# Patient Record
Sex: Male | Born: 1990 | Race: Black or African American | Hispanic: No | Marital: Single | Smoking: Never smoker
Health system: Southern US, Community
[De-identification: ages and names within clinical notes are randomized; demographics above are authoritative.]

## PROBLEM LIST (undated history)

## (undated) DIAGNOSIS — J45909 Unspecified asthma, uncomplicated: Secondary | ICD-10-CM

## (undated) HISTORY — PX: TOTAL HIP ARTHROPLASTY: SHX124

## (undated) HISTORY — PX: TONSILLECTOMY: SUR1361

## (undated) HISTORY — PX: TESTICLE TORSION REDUCTION: SHX795

---

## 2014-09-14 ENCOUNTER — Emergency Department: Payer: Self-pay | Admitting: Emergency Medicine

## 2014-10-14 ENCOUNTER — Emergency Department: Payer: Self-pay | Admitting: Emergency Medicine

## 2014-10-14 LAB — CBC WITH DIFFERENTIAL/PLATELET
Basophil #: 0 10*3/uL (ref 0.0–0.1)
Basophil %: 0.5 %
Eosinophil #: 0.1 10*3/uL (ref 0.0–0.7)
Eosinophil %: 1.4 %
HCT: 48.6 % (ref 40.0–52.0)
HGB: 15.8 g/dL (ref 13.0–18.0)
LYMPHS ABS: 1.1 10*3/uL (ref 1.0–3.6)
Lymphocyte %: 25.1 %
MCH: 24.7 pg — ABNORMAL LOW (ref 26.0–34.0)
MCHC: 32.4 g/dL (ref 32.0–36.0)
MCV: 76 fL — AB (ref 80–100)
MONOS PCT: 17.1 %
Monocyte #: 0.7 x10 3/mm (ref 0.2–1.0)
NEUTROS PCT: 55.9 %
Neutrophil #: 2.4 10*3/uL (ref 1.4–6.5)
PLATELETS: 216 10*3/uL (ref 150–440)
RBC: 6.37 10*6/uL — AB (ref 4.40–5.90)
RDW: 15 % — ABNORMAL HIGH (ref 11.5–14.5)
WBC: 4.2 10*3/uL (ref 3.8–10.6)

## 2014-10-14 LAB — COMPREHENSIVE METABOLIC PANEL
ALBUMIN: 3.7 g/dL (ref 3.4–5.0)
ALK PHOS: 91 U/L (ref 46–116)
AST: 28 U/L (ref 15–37)
Anion Gap: 7 (ref 7–16)
BUN: 12 mg/dL (ref 7–18)
Bilirubin,Total: 0.4 mg/dL (ref 0.2–1.0)
CALCIUM: 9 mg/dL (ref 8.5–10.1)
Chloride: 104 mmol/L (ref 98–107)
Co2: 27 mmol/L (ref 21–32)
Creatinine: 1.15 mg/dL (ref 0.60–1.30)
EGFR (African American): >60
EGFR (Non-African Amer.): >60
Glucose: 92 mg/dL (ref 65–99)
Osmolality: 275 (ref 275–301)
Potassium: 3.3 mmol/L — ABNORMAL LOW (ref 3.5–5.1)
SGPT (ALT): 49 U/L (ref 14–63)
SODIUM: 138 mmol/L (ref 136–145)
TOTAL PROTEIN: 7.9 g/dL (ref 6.4–8.2)

## 2014-10-14 LAB — URINALYSIS, COMPLETE
Bacteria: NONE SEEN
Bilirubin,UR: NEGATIVE
Glucose,UR: NEGATIVE mg/dL (ref 0–75)
Ketone: NEGATIVE
LEUKOCYTE ESTERASE: NEGATIVE
NITRITE: NEGATIVE
Ph: 5 (ref 4.5–8.0)
Protein: 30
Specific Gravity: 1.032 (ref 1.003–1.030)
WBC UR: 1 /HPF (ref 0–5)

## 2014-10-14 LAB — LIPASE, BLOOD: LIPASE: 139 U/L (ref 73–393)

## 2016-06-12 ENCOUNTER — Emergency Department: Payer: BLUE CROSS/BLUE SHIELD

## 2016-06-12 ENCOUNTER — Emergency Department
Admission: EM | Admit: 2016-06-12 | Discharge: 2016-06-12 | Disposition: A | Discharge status: Home / Self Care | Payer: BLUE CROSS/BLUE SHIELD | Attending: Student in an Organized Health Care Education/Training Program | Admitting: Student in an Organized Health Care Education/Training Program

## 2016-06-12 DIAGNOSIS — S39012A Strain of muscle, fascia and tendon of lower back, initial encounter: Secondary | ICD-10-CM

## 2016-06-12 DIAGNOSIS — Y9389 Activity, other specified: Secondary | ICD-10-CM | POA: Diagnosis not present

## 2016-06-12 DIAGNOSIS — Y999 Unspecified external cause status: Secondary | ICD-10-CM | POA: Diagnosis not present

## 2016-06-12 DIAGNOSIS — Y92410 Unspecified street and highway as the place of occurrence of the external cause: Secondary | ICD-10-CM | POA: Insufficient documentation

## 2016-06-12 DIAGNOSIS — S3992XA Unspecified injury of lower back, initial encounter: Secondary | ICD-10-CM | POA: Diagnosis present

## 2016-06-12 MED ORDER — METHOCARBAMOL 500 MG PO TABS: 500.0000 mg | ORAL_TABLET | Freq: Four times a day (QID) | ORAL | 0 refills | Status: AC

## 2016-06-12 MED ORDER — MELOXICAM 15 MG PO TABS: 15.0000 mg | ORAL_TABLET | Freq: Every day | ORAL | 0 refills | Status: AC
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
## 2017-08-31 ENCOUNTER — Encounter: Payer: Self-pay | Admitting: Emergency Medicine

## 2017-08-31 ENCOUNTER — Emergency Department
Admission: EM | Admit: 2017-08-31 | Discharge: 2017-08-31 | Disposition: A | Discharge status: Home / Self Care | Payer: BLUE CROSS/BLUE SHIELD | Attending: Emergency Medicine | Admitting: Emergency Medicine

## 2017-08-31 DIAGNOSIS — J0100 Acute maxillary sinusitis, unspecified: Secondary | ICD-10-CM | POA: Diagnosis not present

## 2017-08-31 DIAGNOSIS — J45909 Unspecified asthma, uncomplicated: Secondary | ICD-10-CM | POA: Insufficient documentation

## 2017-08-31 DIAGNOSIS — R0981 Nasal congestion: Secondary | ICD-10-CM | POA: Diagnosis present

## 2017-08-31 DIAGNOSIS — Z79899 Other long term (current) drug therapy: Secondary | ICD-10-CM | POA: Insufficient documentation

## 2017-08-31 HISTORY — DX: Unspecified asthma, uncomplicated: J45.909

## 2017-08-31 MED ORDER — NAPROXEN 500 MG PO TABS: 500.0000 mg | ORAL_TABLET | Freq: Two times a day (BID) | ORAL | Status: AC

## 2017-08-31 MED ORDER — ALBUTEROL SULFATE HFA 108 (90 BASE) MCG/ACT IN AERS: 2.0000 | INHALATION_SPRAY | Freq: Four times a day (QID) | RESPIRATORY_TRACT | 2 refills | Status: AC | PRN

## 2017-08-31 MED ORDER — BENZONATATE 100 MG PO CAPS: 200.0000 mg | ORAL_CAPSULE | Freq: Three times a day (TID) | ORAL | 0 refills | Status: AC | PRN

## 2017-08-31 MED ORDER — AMOXICILLIN 500 MG PO CAPS: 500.0000 mg | ORAL_CAPSULE | Freq: Three times a day (TID) | ORAL | 0 refills | Status: AC

## 2017-08-31 MED ORDER — FEXOFENADINE-PSEUDOEPHED ER 60-120 MG PO TB12: 1.0000 | ORAL_TABLET | Freq: Two times a day (BID) | ORAL | 0 refills | Status: AC

## 2017-08-31 NOTE — ED Notes (Signed)
Pt states two days of nasal congestion, scratchy throat and generalized body aches with chills. Pt denies known fever. Pt denies other symptoms. No red swollen tonsils noted on exam. Pt appears in no acute distress. Clear nasal drainage noted.

## 2017-08-31 NOTE — ED Triage Notes (Signed)
Patient with complaint of body aches, chills and congestion times two days. Patient denies fever.

## 2017-08-31 NOTE — ED Provider Notes (Signed)
[facility] Emergency Department Provider Note   ____________________________________________   First MD Initiated Contact with Patient 08/31/17 1954     (approximate)  I have reviewed the triage vital signs and the nursing notes.   HISTORY  Chief Complaint Generalized Body Aches and Nasal Congestion    HPI Collin Cox is a 26 y.o. male patient complaining of body aches, chills, and nasal congestion for 2 days. Patient denies fever. Patient denies nausea, vomiting, diarrhea. Patient also requesting refill of his pro-air for his asthmatic condition.Patient rates his pain as a 6/10. Patient describes pain as "achy". No palliative measures for complaint.   Past Medical History:  Diagnosis Date  . Asthma     There are no active problems to display for this patient.   Past Surgical History:  Procedure Laterality Date  . TOTAL HIP ARTHROPLASTY      Prior to Admission medications   Medication Sig Start Date End Date Taking? Authorizing Provider  albuterol (PROVENTIL HFA;VENTOLIN HFA) 108 (90 Base) MCG/ACT inhaler Inhale 2 puffs into the lungs every 6 (six) hours as needed for wheezing or shortness of breath. 08/31/17   Joni ReiningSmith, Milana Salay K, PA-C  amoxicillin (AMOXIL) 500 MG capsule Take 1 capsule (500 mg total) by mouth 3 (three) times daily. 08/31/17   Joni ReiningSmith, Hasheem Voland K, PA-C  benzonatate (TESSALON PERLES) 100 MG capsule Take 2 capsules (200 mg total) by mouth 3 (three) times daily as needed for cough. 08/31/17 08/31/18  Joni ReiningSmith, Hurbert Duran K, PA-C  fexofenadine-pseudoephedrine (ALLEGRA-D) 60-120 MG 12 hr tablet Take 1 tablet by mouth 2 (two) times daily. 08/31/17   Joni ReiningSmith, Lorie Cleckley K, PA-C  meloxicam (MOBIC) 15 MG tablet Take 1 tablet (15 mg total) by mouth daily. 06/12/16   Cuthriell, Delorise RoyalsJonathan D, PA-C  methocarbamol (ROBAXIN) 500 MG tablet Take 1 tablet (500 mg total) by mouth 4 (four) times daily. 06/12/16   Cuthriell, Delorise RoyalsJonathan D, PA-C  naproxen (NAPROSYN) 500 MG  tablet Take 1 tablet (500 mg total) by mouth 2 (two) times daily with a meal. 08/31/17   Joni ReiningSmith, Jadalee Westcott K, PA-C    Allergies Peanut-containing drug products  No family history on file.  Social History Social History   Tobacco Use  . Smoking status: Never Smoker  . Smokeless tobacco: Current User  Substance Use Topics  . Alcohol use: Yes  . Drug use: No    Review of Systems Constitutional: No fever/chills Eyes: No visual changes. ENT: No sore throat. Nasal congestion and facial pain Cardiovascular: Denies chest pain. Respiratory: Denies shortness of breath. Gastrointestinal: No abdominal pain.  No nausea, no vomiting.  No diarrhea.  No constipation. Genitourinary: Negative for dysuria. Musculoskeletal: Negative for back pain. Skin: Negative for rash. Neurological: Positive for headaches, denies focal weakness or numbness. Allergic/Immunilogical: Peanuts ____________________________________________   PHYSICAL EXAM:  VITAL SIGNS: ED Triage Vitals  Enc Vitals Group     BP 08/31/17 1911 (!) 121/57     Pulse Rate 08/31/17 1911 100     Resp 08/31/17 1911 18     Temp 08/31/17 1911 98.6 F (37 C)     Temp Source 08/31/17 1911 Oral     SpO2 08/31/17 1911 98 %     Weight 08/31/17 1910 225 lb (102.1 kg)     Height 08/31/17 1910 5\' 5"  (1.651 m)     Head Circumference --      Peak Flow --      Pain Score 08/31/17 1909 6     Pain Loc --  Pain Edu? --      Excl. in GC? --    Constitutional: Alert and oriented. Well appearing and in no acute distress. Nose: Bilateral frontal maxillary guarding. Edematous nasal Turbinates with thick greenish rhinorrhea. Mouth/Throat: Mucous membranes are moist.  Oropharynx non-erythematous. Postnasal drainage Neck: No stridor. Hematological/Lymphatic/Immunilogical: No cervical lymphadenopathy. Cardiovascular: Normal rate, regular rhythm. Grossly normal heart sounds.  Good peripheral circulation. Respiratory: Normal respiratory effort.  No  retractions. Lungs CTAB. Neurologic:  Normal speech and language. No gross focal neurologic deficits are appreciated. No gait instability. Skin:  Skin is warm, dry and intact. No rash noted. Psychiatric: Mood and affect are normal. Speech and behavior are normal.  ____________________________________________   LABS (all labs ordered are listed, but only abnormal results are displayed)  Labs Reviewed - No data to display ____________________________________________  EKG   ____________________________________________  RADIOLOGY  No results found.  ____________________________________________   PROCEDURES  Procedure(s) performed: None  Procedures  Critical Care performed: No  ____________________________________________   INITIAL IMPRESSION / ASSESSMENT AND PLAN / ED COURSE  As part of my medical decision making, I reviewed the following data within the electronic MEDICAL RECORD NUMBER    Sinusitis. Patient given discharge Instructions. Patient advised take medication as directed. Patient advised [facility] with [facility] condition persists.      ____________________________________________   FINAL CLINICAL IMPRESSION(S) / ED DIAGNOSES  Final diagnoses:  Subacute maxillary sinusitis     ED Discharge Orders        Ordered    amoxicillin (AMOXIL) 500 MG capsule  3 times daily     08/31/17 1959    fexofenadine-pseudoephedrine (ALLEGRA-D) 60-120 MG 12 hr tablet  2 times daily     08/31/17 1959    benzonatate (TESSALON PERLES) 100 MG capsule  3 times daily PRN     08/31/17 1959    naproxen (NAPROSYN) 500 MG tablet  2 times daily with meals     08/31/17 1959    albuterol (PROVENTIL HFA;VENTOLIN HFA) 108 (90 Base) MCG/ACT inhaler  Every 6 hours PRN     08/31/17 2001       Note:  This document was prepared using Dragon voice recognition software and may include unintentional dictation errors.    Joni ReiningSmith, Brady Schiller K, PA-C 08/31/17 2006      Arnaldo NatalMalinda, Paul F, MD 08/31/17 2008

## 2017-10-19 ENCOUNTER — Emergency Department
Admission: EM | Admit: 2017-10-19 | Discharge: 2017-10-19 | Disposition: A | Discharge status: Home / Self Care | Payer: BLUE CROSS/BLUE SHIELD | Attending: Emergency Medicine | Admitting: Emergency Medicine

## 2017-10-19 ENCOUNTER — Encounter: Payer: Self-pay | Admitting: Emergency Medicine

## 2017-10-19 DIAGNOSIS — R1084 Generalized abdominal pain: Secondary | ICD-10-CM | POA: Diagnosis present

## 2017-10-19 DIAGNOSIS — Z79899 Other long term (current) drug therapy: Secondary | ICD-10-CM | POA: Insufficient documentation

## 2017-10-19 DIAGNOSIS — R109 Unspecified abdominal pain: Secondary | ICD-10-CM

## 2017-10-19 DIAGNOSIS — J45909 Unspecified asthma, uncomplicated: Secondary | ICD-10-CM | POA: Diagnosis not present

## 2017-10-19 LAB — CBC
HCT: 47.9 % (ref 40.0–52.0)
Hemoglobin: 15.7 g/dL (ref 13.0–18.0)
MCH: 24.4 pg — AB (ref 26.0–34.0)
MCHC: 32.7 g/dL (ref 32.0–36.0)
MCV: 74.6 fL — AB (ref 80.0–100.0)
PLATELETS: 273 10*3/uL (ref 150–440)
RBC: 6.42 MIL/uL — ABNORMAL HIGH (ref 4.40–5.90)
RDW: 15.2 % — AB (ref 11.5–14.5)
WBC: 9.6 10*3/uL (ref 3.8–10.6)

## 2017-10-19 LAB — URINALYSIS, COMPLETE (UACMP) WITH MICROSCOPIC
Bacteria, UA: NONE SEEN
Bilirubin Urine: NEGATIVE
Glucose, UA: NEGATIVE mg/dL
Ketones, ur: NEGATIVE mg/dL
Leukocytes, UA: NEGATIVE
Nitrite: NEGATIVE
PH: 5 (ref 5.0–8.0)
Protein, ur: NEGATIVE mg/dL
SPECIFIC GRAVITY, URINE: 1.026 (ref 1.005–1.030)

## 2017-10-19 LAB — COMPREHENSIVE METABOLIC PANEL
ALK PHOS: 108 U/L (ref 38–126)
ALT: 42 U/L (ref 17–63)
AST: 36 U/L (ref 15–41)
Albumin: 4.3 g/dL (ref 3.5–5.0)
Anion gap: 9 (ref 5–15)
BUN: 14 mg/dL (ref 6–20)
CHLORIDE: 103 mmol/L (ref 101–111)
CO2: 26 mmol/L (ref 22–32)
CREATININE: 0.98 mg/dL (ref 0.61–1.24)
Calcium: 9 mg/dL (ref 8.9–10.3)
GFR calc Af Amer: >60 mL/min (ref >60)
Glucose, Bld: 99 mg/dL (ref 65–99)
Potassium: 3.6 mmol/L (ref 3.5–5.1)
SODIUM: 138 mmol/L (ref 135–145)
Total Bilirubin: 0.9 mg/dL (ref 0.3–1.2)
Total Protein: 8 g/dL (ref 6.5–8.1)

## 2017-10-19 LAB — LIPASE, BLOOD: Lipase: 45 U/L (ref 11–51)

## 2017-10-19 NOTE — ED Triage Notes (Signed)
Patient with complaint of intermittent lower abdominal pain with nausea that started today. Denies urinary symptoms.

## 2017-10-19 NOTE — ED Notes (Addendum)
Pt states lower belly pain that began last week with N&V&D. Pt was told he had a "stomach bug" states he got over that and felt better but symptoms began again this AM. Denies urinary symptoms. Alert, oriented, no distress noted.

## 2017-10-19 NOTE — ED Provider Notes (Signed)
[facility] Emergency Department Provider Note  ____________________________________________   I have reviewed the triage vital signs and the nursing notes. Where available I have reviewed prior notes and, if possible and indicated, outside hospital notes.    HISTORY  Chief Complaint Abdominal Pain    HPI Collin Cox is a 27 y.o. male who presents today complaining of a crampy discomfort in his abdomen which is been there off and on a little bit today.  He is not having it now.  He has no pain of any variety at this time.  He denies any nausea vomiting or diarrhea.  He actually has had no other symptoms he did have a abdominal viral pathology believes 1 week ago with nausea vomiting diarrhea but that is gone.  He is worried it might be coming back as he had a cramp today.  He states he was in the lower abdomen region, but he cannot be specific about where it hurt because it is gone and only her for a brief period of time.  Crampy discomfort, no radiation, happened at rest, no other associated symptoms no prior treatment.  Severity was mild.  And it is completely gone     Past Medical History:  Diagnosis Date  . Asthma     There are no active problems to display for this patient.   Past Surgical History:  Procedure Laterality Date  . TESTICLE TORSION REDUCTION    . TONSILLECTOMY    . TOTAL HIP ARTHROPLASTY      Prior to Admission medications   Medication Sig Start Date End Date Taking? Authorizing Provider  albuterol (PROVENTIL HFA;VENTOLIN HFA) 108 (90 Base) MCG/ACT inhaler Inhale 2 puffs into the lungs every 6 (six) hours as needed for wheezing or shortness of breath. 08/31/17   Joni Reining, PA-C  amoxicillin (AMOXIL) 500 MG capsule Take 1 capsule (500 mg total) by mouth 3 (three) times daily. 08/31/17   Joni Reining, PA-C  benzonatate (TESSALON PERLES) 100 MG capsule Take 2 capsules (200 mg total) by mouth 3 (three) times daily as needed for  cough. 08/31/17 08/31/18  Joni Reining, PA-C  fexofenadine-pseudoephedrine (ALLEGRA-D) 60-120 MG 12 hr tablet Take 1 tablet by mouth 2 (two) times daily. 08/31/17   Joni Reining, PA-C  meloxicam (MOBIC) 15 MG tablet Take 1 tablet (15 mg total) by mouth daily. 06/12/16   Cuthriell, Delorise Royals, PA-C  methocarbamol (ROBAXIN) 500 MG tablet Take 1 tablet (500 mg total) by mouth 4 (four) times daily. 06/12/16   Cuthriell, Delorise Royals, PA-C  naproxen (NAPROSYN) 500 MG tablet Take 1 tablet (500 mg total) by mouth 2 (two) times daily with a meal. 08/31/17   Joni Reining, PA-C    Allergies Peanut-containing drug products  No family history on file.  Social History Social History   Tobacco Use  . Smoking status: Never Smoker  . Smokeless tobacco: Current User  Substance Use Topics  . Alcohol use: Yes    Comment: occ  . Drug use: No    Review of Systems Constitutional: No fever/chills Eyes: No visual changes. ENT: No sore throat. No stiff neck no neck pain Cardiovascular: Denies chest pain. Respiratory: Denies shortness of breath. Gastrointestinal:   no vomiting.  No diarrhea.  No constipation. Genitourinary: Negative for dysuria. Musculoskeletal: Negative lower extremity swelling Skin: Negative for rash. Neurological: Negative for severe headaches, focal weakness or numbness.   ____________________________________________   PHYSICAL EXAM:  VITAL SIGNS: ED Triage Vitals [10/19/17  2035]  Enc Vitals Group     BP (!) 122/26     Pulse Rate 77     Resp 18     Temp 98.4 F (36.9 C)     Temp Source Oral     SpO2 98 %     Weight 215 lb (97.5 kg)     Height 5\' 5"  (1.651 m)     Head Circumference      Peak Flow      Pain Score 8     Pain Loc      Pain Edu?      Excl. in GC?     Constitutional: Alert and oriented. Well appearing and in no acute distress. Eyes: Conjunctivae are normal Head: Atraumatic HEENT: No congestion/rhinnorhea. Mucous membranes are moist.   Oropharynx non-erythematous Neck:   Nontender with no meningismus, no masses, no stridor Cardiovascular: Normal rate, regular rhythm. Grossly normal heart sounds.  Good peripheral circulation. Respiratory: Normal respiratory effort.  No retractions. Lungs CTAB. Abdominal: Soft and nontender. No distention. No guarding no rebound Back:  There is no focal tenderness or step off.  there is no midline tenderness there are no lesions noted. there is no CVA tenderness GU: Normal external male genitalia no evidence of torsion or lesions or mass Musculoskeletal: No lower extremity tenderness, no upper extremity tenderness. No joint effusions, no DVT signs strong distal pulses no edema Neurologic:  Normal speech and language. No gross focal neurologic deficits are appreciated.  Skin:  Skin is warm, dry and intact. No rash noted. Psychiatric: Mood and affect are normal. Speech and behavior are normal.  ____________________________________________   LABS (all labs ordered are listed, but only abnormal results are displayed)  Labs Reviewed  CBC - Abnormal; Notable for the following components:      Result Value   RBC 6.42 (*)    MCV 74.6 (*)    MCH 24.4 (*)    RDW 15.2 (*)    All other components within normal limits  URINALYSIS, COMPLETE (UACMP) WITH MICROSCOPIC - Abnormal; Notable for the following components:   Color, Urine YELLOW (*)    APPearance CLEAR (*)    Hgb urine dipstick SMALL (*)    Squamous Epithelial / LPF 0-5 (*)    All other components within normal limits  COMPREHENSIVE METABOLIC PANEL  LIPASE, BLOOD    Pertinent labs  results that were available during my care of the patient were reviewed by me and considered in my medical decision making (see chart for details). ____________________________________________  EKG  I personally interpreted any EKGs ordered by me or triage  ____________________________________________  RADIOLOGY  Pertinent labs & imaging results that  were available during my care of the patient were reviewed by me and considered in my medical decision making (see chart for details). If possible, patient and/or family made aware of any abnormal findings.  No results found. ____________________________________________    PROCEDURES  Procedure(s) performed: None  Procedures  Critical Care performed: None  ____________________________________________   INITIAL IMPRESSION / ASSESSMENT AND PLAN / ED COURSE  Pertinent labs & imaging results that were available during my care of the patient were reviewed by me and considered in my medical decision making (see chart for details).  Patient had a brief period of abdominal cramping which is completely gone and unreproducible by me, blood work is reassuring, no evidence considering the patient's symptoms, medical history, and physical examination today, I have low suspicion for cholecystitis or biliary pathology, pancreatitis,  perforation or bowel obstruction, hernia, intra-abdominal abscess, AAA or dissection, volvulus or intussusception, mesenteric ischemia, ischemic gut, pyelonephritis or appendicitis.   ____________________________________________   FINAL CLINICAL IMPRESSION(S) / ED DIAGNOSES  Final diagnoses:  None      This chart was dictated using voice recognition software.  Despite best efforts to proofread,  errors can occur which can change meaning.      Jeanmarie PlantMcShane, Kyheem Bathgate A, MD 10/19/17 2213

## 2017-10-19 NOTE — ED Notes (Signed)
Pt given gingerale and graham crackers for PO challenge  

## 2018-04-01 ENCOUNTER — Other Ambulatory Visit: Payer: Self-pay

## 2018-04-01 ENCOUNTER — Emergency Department
Admission: EM | Admit: 2018-04-01 | Discharge: 2018-04-01 | Disposition: A | Discharge status: Home / Self Care | Payer: BLUE CROSS/BLUE SHIELD | Attending: Emergency Medicine | Admitting: Emergency Medicine

## 2018-04-01 ENCOUNTER — Encounter: Payer: Self-pay | Admitting: *Deleted

## 2018-04-01 DIAGNOSIS — R51 Headache: Secondary | ICD-10-CM | POA: Diagnosis not present

## 2018-04-01 DIAGNOSIS — R42 Dizziness and giddiness: Secondary | ICD-10-CM | POA: Diagnosis present

## 2018-04-01 DIAGNOSIS — Z79899 Other long term (current) drug therapy: Secondary | ICD-10-CM | POA: Insufficient documentation

## 2018-04-01 DIAGNOSIS — E860 Dehydration: Secondary | ICD-10-CM | POA: Diagnosis not present

## 2018-04-01 DIAGNOSIS — F17290 Nicotine dependence, other tobacco product, uncomplicated: Secondary | ICD-10-CM | POA: Insufficient documentation

## 2018-04-01 DIAGNOSIS — J45909 Unspecified asthma, uncomplicated: Secondary | ICD-10-CM | POA: Insufficient documentation

## 2018-04-01 DIAGNOSIS — R110 Nausea: Secondary | ICD-10-CM | POA: Insufficient documentation

## 2018-04-01 DIAGNOSIS — Z96649 Presence of unspecified artificial hip joint: Secondary | ICD-10-CM | POA: Insufficient documentation

## 2018-04-01 LAB — URINALYSIS, COMPLETE (UACMP) WITH MICROSCOPIC
BACTERIA UA: NONE SEEN
BILIRUBIN URINE: NEGATIVE
GLUCOSE, UA: NEGATIVE mg/dL
Ketones, ur: NEGATIVE mg/dL
LEUKOCYTES UA: NEGATIVE
NITRITE: NEGATIVE
PH: 5 (ref 5.0–8.0)
Protein, ur: NEGATIVE mg/dL
SPECIFIC GRAVITY, URINE: 1.019 (ref 1.005–1.030)

## 2018-04-01 LAB — CBC
HCT: 43.8 % (ref 40.0–52.0)
Hemoglobin: 14.8 g/dL (ref 13.0–18.0)
MCH: 25.6 pg — ABNORMAL LOW (ref 26.0–34.0)
MCHC: 33.8 g/dL (ref 32.0–36.0)
MCV: 75.5 fL — ABNORMAL LOW (ref 80.0–100.0)
Platelets: 185 10*3/uL (ref 150–440)
RBC: 5.8 MIL/uL (ref 4.40–5.90)
RDW: 15.5 % — AB (ref 11.5–14.5)
WBC: 4.2 10*3/uL (ref 3.8–10.6)

## 2018-04-01 LAB — BASIC METABOLIC PANEL
ANION GAP: 8 (ref 5–15)
BUN: 11 mg/dL (ref 6–20)
CALCIUM: 9 mg/dL (ref 8.9–10.3)
CO2: 26 mmol/L (ref 22–32)
Chloride: 107 mmol/L (ref 98–111)
Creatinine, Ser: 1.11 mg/dL (ref 0.61–1.24)
GFR calc Af Amer: >60 mL/min (ref >60)
Glucose, Bld: 121 mg/dL — ABNORMAL HIGH (ref 70–99)
Potassium: 3.5 mmol/L (ref 3.5–5.1)
SODIUM: 141 mmol/L (ref 135–145)

## 2018-04-01 MED ORDER — SODIUM CHLORIDE 0.9 % IV BOLUS
1000.0000 mL | Freq: Once | INTRAVENOUS | Status: AC
  Administered 2018-04-01: 1000 mL via INTRAVENOUS

## 2018-04-01 NOTE — ED Notes (Signed)
ED Provider at bedside. 

## 2018-04-01 NOTE — ED Notes (Signed)
Report off to vanessa rn 

## 2018-04-01 NOTE — ED Triage Notes (Signed)
Pt to Ed reporting two days of dizziness, headache, cold chills and nausea that has not improved. Pt has been drinking water and denies vomiting or diarrhea. Pt reports having felt warm yesterday but did not check a temp. No abd pain reported. No hx of vertigo.

## 2018-04-01 NOTE — ED Provider Notes (Signed)
[facility] Emergency Department Provider Note   ____________________________________________   I have reviewed the triage vital signs and the nursing notes.   HISTORY  Chief Complaint Dizziness and Nausea   History limited by: Not Limited   HPI Collin Cox is a 27 y.o. male who presents to the emergency department today because of concern for dizziness and nausea.  He states that he started feeling bad yesterday.  He appreciated when he woke up.  He noticed that he started feeling dizzy especially when he was sitting down.  He also felt nauseous although had no vomiting.  He has had some associated headache.  Denies any fevers.  Per medical record review patient has a history of asthma  Past Medical History:  Diagnosis Date  . Asthma     There are no active problems to display for this patient.   Past Surgical History:  Procedure Laterality Date  . TESTICLE TORSION REDUCTION    . TONSILLECTOMY    . TOTAL HIP ARTHROPLASTY      Prior to Admission medications   Medication Sig Start Date End Date Taking? Authorizing Provider  albuterol (PROVENTIL HFA;VENTOLIN HFA) 108 (90 Base) MCG/ACT inhaler Inhale 2 puffs into the lungs every 6 (six) hours as needed for wheezing or shortness of breath. 08/31/17   Joni ReiningSmith, Ronald K, PA-C  amoxicillin (AMOXIL) 500 MG capsule Take 1 capsule (500 mg total) by mouth 3 (three) times daily. 08/31/17   Joni ReiningSmith, Ronald K, PA-C  benzonatate (TESSALON PERLES) 100 MG capsule Take 2 capsules (200 mg total) by mouth 3 (three) times daily as needed for cough. 08/31/17 08/31/18  Joni ReiningSmith, Ronald K, PA-C  fexofenadine-pseudoephedrine (ALLEGRA-D) 60-120 MG 12 hr tablet Take 1 tablet by mouth 2 (two) times daily. 08/31/17   Joni ReiningSmith, Ronald K, PA-C  meloxicam (MOBIC) 15 MG tablet Take 1 tablet (15 mg total) by mouth daily. 06/12/16   Cuthriell, Delorise RoyalsJonathan D, PA-C  methocarbamol (ROBAXIN) 500 MG tablet Take 1 tablet (500 mg total) by mouth 4  (four) times daily. 06/12/16   Cuthriell, Delorise RoyalsJonathan D, PA-C  naproxen (NAPROSYN) 500 MG tablet Take 1 tablet (500 mg total) by mouth 2 (two) times daily with a meal. 08/31/17   Joni ReiningSmith, Ronald K, PA-C    Allergies Peanut-containing drug products  History reviewed. No pertinent family history.  Social History Social History   Tobacco Use  . Smoking status: Never Smoker  . Smokeless tobacco: Current User  Substance Use Topics  . Alcohol use: Yes    Comment: occ  . Drug use: No    Review of Systems Constitutional: No fever/chills Eyes: No visual changes. ENT: No sore throat. Cardiovascular: Denies chest pain. Respiratory: Denies shortness of breath. Gastrointestinal: Positive for nausea.  Genitourinary: Negative for dysuria. Musculoskeletal: Negative for back pain. Skin: Negative for rash. Neurological: Positive for headache and dizziness.  ____________________________________________   PHYSICAL EXAM:  VITAL SIGNS: ED Triage Vitals [04/01/18 1651]  Enc Vitals Group     BP      Pulse Rate (!) 56     Resp 16     Temp 98.7 F (37.1 C)     Temp Source Oral     SpO2 95 %     Weight 194 lb (88 kg)     Height 5\' 5"  (1.651 m)     Head Circumference      Peak Flow      Pain Score 5   Constitutional: Alert and oriented.  Eyes: Conjunctivae are normal.  ENT      Head: Normocephalic and atraumatic.      Nose: No congestion/rhinnorhea.      Mouth/Throat: Mucous membranes are moist.      Neck: No stridor. Hematological/Lymphatic/Immunilogical: No cervical lymphadenopathy. Cardiovascular: Normal rate, regular rhythm.  No murmurs, rubs, or gallops.  Respiratory: Normal respiratory effort without tachypnea nor retractions. Breath sounds are clear and equal bilaterally. No wheezes/rales/rhonchi. Gastrointestinal: Soft and non tender. No rebound. No guarding.  Genitourinary: Deferred Musculoskeletal: Normal range of motion in all extremities. No lower extremity  edema. Neurologic:  Normal speech and language. No gross focal neurologic deficits are appreciated.  Skin:  Skin is warm, dry and intact. No rash noted. Psychiatric: Mood and affect are normal. Speech and behavior are normal. Patient exhibits appropriate insight and judgment.  ____________________________________________    LABS (pertinent positives/negatives)  BMP na 141, k 3.5, glu 121, cr 1.11 CBC wbc 4.2, hgb 14.8, plt 185 UA hazy, 6-10 wbc, bacteria none seen  ____________________________________________   EKG  I, Phineas Semen, attending physician, personally viewed and interpreted this EKG  EKG Time: 1655 Rate: 79 Rhythm: normal sinus rhythm Axis: normal Intervals: qtc 435 QRS: narrow ST changes: no st elevation Impression: normal ekg   ____________________________________________    RADIOLOGY  None  ____________________________________________   PROCEDURES  Procedures  ____________________________________________   INITIAL IMPRESSION / ASSESSMENT AND PLAN / ED COURSE  Pertinent labs & imaging results that were available during my care of the patient were reviewed by me and considered in my medical decision making (see chart for details).   Patient presented with symptoms concerning for possible heat exhaustion and dehydration.  Was given IV fluids and stated that he felt better after fluids.  He was able to get up and walk around.  Discussed importance of hydration with the patient.   ____________________________________________   FINAL CLINICAL IMPRESSION(S) / ED DIAGNOSES  Final diagnoses:  Dehydration     Note: This dictation was prepared with Dragon dictation. Any transcriptional errors that result from this process are unintentional     Phineas Semen, MD 04/01/18 1942

## 2018-04-01 NOTE — Discharge Instructions (Addendum)
Please seek medical attention for any high fevers, chest pain, shortness of breath, change in behavior, persistent vomiting, bloody stool or any other new or concerning symptoms.  

## 2018-04-01 NOTE — ED Notes (Signed)
Pt reports dizziness and a headache.  Sx for 3 days.  Sx worse today.  No n/v.  Pt took asa today for the h/a.  No relief with otc meds.  Pt alert  Speech clear.

## 2018-08-11 ENCOUNTER — Emergency Department
Admission: EM | Admit: 2018-08-11 | Discharge: 2018-08-11 | Disposition: A | Discharge status: Home / Self Care | Payer: BLUE CROSS/BLUE SHIELD | Attending: Emergency Medicine | Admitting: Emergency Medicine

## 2018-08-11 ENCOUNTER — Other Ambulatory Visit: Payer: Self-pay

## 2018-08-11 DIAGNOSIS — F17228 Nicotine dependence, chewing tobacco, with other nicotine-induced disorders: Secondary | ICD-10-CM | POA: Diagnosis not present

## 2018-08-11 DIAGNOSIS — J45909 Unspecified asthma, uncomplicated: Secondary | ICD-10-CM | POA: Diagnosis not present

## 2018-08-11 DIAGNOSIS — Z96649 Presence of unspecified artificial hip joint: Secondary | ICD-10-CM | POA: Insufficient documentation

## 2018-08-11 DIAGNOSIS — J020 Streptococcal pharyngitis: Secondary | ICD-10-CM | POA: Diagnosis not present

## 2018-08-11 DIAGNOSIS — Z91010 Allergy to peanuts: Secondary | ICD-10-CM | POA: Insufficient documentation

## 2018-08-11 DIAGNOSIS — R070 Pain in throat: Secondary | ICD-10-CM | POA: Diagnosis present

## 2018-08-11 LAB — GROUP A STREP BY PCR: GROUP A STREP BY PCR: DETECTED — AB

## 2018-08-11 MED ORDER — PENICILLIN G BENZATHINE 1200000 UNIT/2ML IM SUSP
1.2000 10*6.[IU] | Freq: Once | INTRAMUSCULAR | Status: AC
  Administered 2018-08-11: 1.2 10*6.[IU] via INTRAMUSCULAR
  Filled 2018-08-11: qty 2

## 2018-08-11 NOTE — ED Provider Notes (Signed)
[facility] Emergency Department Provider Note ____________________________________________  Time seen: 1958  I have reviewed the triage vital signs and the nursing notes.  HISTORY  Chief Complaint  Sore Throat  HPI Collin Cox is a 27 y.o. male who presents himself to the ED for evaluation of sudden onset of sore throat and subjective fevers.  Patient describes his wife was recently diagnosed with strep pharyngitis.  Past Medical History:  Diagnosis Date  . Asthma     There are no active problems to display for this patient.   Past Surgical History:  Procedure Laterality Date  . TESTICLE TORSION REDUCTION    . TONSILLECTOMY    . TOTAL HIP ARTHROPLASTY      Prior to Admission medications   Medication Sig Start Date End Date Taking? Authorizing Provider  albuterol (PROVENTIL HFA;VENTOLIN HFA) 108 (90 Base) MCG/ACT inhaler Inhale 2 puffs into the lungs every 6 (six) hours as needed for wheezing or shortness of breath. 08/31/17   Collin Cox, Collin K, PA-C  amoxicillin (AMOXIL) 500 MG capsule Take 1 capsule (500 mg total) by mouth 3 (three) times daily. 08/31/17   Collin Cox, Collin K, PA-C  benzonatate (TESSALON PERLES) 100 MG capsule Take 2 capsules (200 mg total) by mouth 3 (three) times daily as needed for cough. 08/31/17 08/31/18  Collin Cox, Collin K, PA-C  fexofenadine-pseudoephedrine (ALLEGRA-Cox) 60-120 MG 12 hr tablet Take 1 tablet by mouth 2 (two) times daily. 08/31/17   Collin Cox, Collin K, PA-C  meloxicam (MOBIC) 15 MG tablet Take 1 tablet (15 mg total) by mouth daily. 06/12/16   Collin Cox D, PA-C  methocarbamol (ROBAXIN) 500 MG tablet Take 1 tablet (500 mg total) by mouth 4 (four) times daily. 06/12/16   Collin Cox D, PA-C  naproxen (NAPROSYN) 500 MG tablet Take 1 tablet (500 mg total) by mouth 2 (two) times daily with a meal. 08/31/17   Collin Cox, Collin K, PA-C    Allergies Peanut-containing drug products  No family history on file.  Social  History Social History   Tobacco Use  . Smoking status: Never Smoker  . Smokeless tobacco: Current User  Substance Use Topics  . Alcohol use: Yes    Comment: occ  . Drug use: No    Review of Systems  Constitutional: Positive for fever. Eyes: Negative for visual changes. ENT: Active for sore throat. Cardiovascular: Negative for chest pain. Respiratory: Negative for shortness of breath. Gastrointestinal: Negative for abdominal pain, vomiting and diarrhea. Genitourinary: Negative for dysuria. Musculoskeletal: Negative for back pain. Skin: Negative for rash. Neurological: Negative for headaches, focal weakness or numbness. ____________________________________________  PHYSICAL EXAM:  VITAL SIGNS: ED Triage Vitals  Enc Vitals Group     BP 08/11/18 1934 (!) 132/92     Pulse Rate 08/11/18 1934 (!) 101     Resp 08/11/18 1934 16     Temp 08/11/18 1934 99.7 F (37.6 C)     Temp Source 08/11/18 1934 Oral     SpO2 08/11/18 1934 97 %     Weight 08/11/18 1934 220 lb (99.8 kg)     Height 08/11/18 1934 5\' 9"  (1.753 m)     Head Circumference --      Peak Flow --      Pain Score 08/11/18 1936 5     Pain Loc --      Pain Edu? --      Excl. in [place]? --     Constitutional: Alert and oriented. Well appearing and in no distress. Head:  Normocephalic and atraumatic. Eyes: Conjunctivae are normal. PERRL. Normal extraocular movements Mouth/Throat: Mucous membranes are moist.  Uvula is midline and tonsils are erythematous, and edematous. Neck: Supple. No thyromegaly. Hematological/Lymphatic/Immunological: No cervical lymphadenopathy. Cardiovascular: Normal rate, regular rhythm. Normal distal pulses. Respiratory: Normal respiratory effort. No wheezes/rales/rhonchi. ____________________________________________   LABS (pertinent positives/negatives) Labs Reviewed  GROUP A STREP BY PCR - Abnormal; Notable for the following components:      Result Value   Group A Strep by PCR DETECTED (*)     All other components within normal limits  ____________________________________________  PROCEDURES  Procedures Penicillin G 1.2 million units IM ____________________________________________  INITIAL IMPRESSION / ASSESSMENT AND PLAN / ED COURSE  Patient with ED evaluation of sore throat. His strep PCR is positive. He is treated with a single dose of penicillin G in the ED. A work note is provided for 2 days.  ____________________________________________  FINAL CLINICAL IMPRESSION(S) / ED DIAGNOSES  Final diagnoses:  Strep pharyngitis      Collin Cox, Collin Ivory, PA-C 08/11/18 2148    Collin Semen, MD 08/11/18 2241

## 2018-08-11 NOTE — Discharge Instructions (Signed)
You have been confirmed to have strep throat. You have been treated with a single dose of penicillin in the Emergency Department. Continue to monitor and treat fevers with Tylenol and Motrin. Drink plenty of fluids and rest as needed.

## 2018-08-11 NOTE — ED Triage Notes (Signed)
Pt in with co sore throat since yest and fever.

## 2019-02-28 ENCOUNTER — Other Ambulatory Visit: Payer: Self-pay

## 2019-02-28 ENCOUNTER — Emergency Department
Admission: EM | Admit: 2019-02-28 | Discharge: 2019-02-28 | Disposition: A | Discharge status: Home / Self Care | Payer: BC Managed Care – PPO | Attending: Emergency Medicine | Admitting: Emergency Medicine

## 2019-02-28 DIAGNOSIS — J45901 Unspecified asthma with (acute) exacerbation: Secondary | ICD-10-CM | POA: Diagnosis not present

## 2019-02-28 DIAGNOSIS — Z79899 Other long term (current) drug therapy: Secondary | ICD-10-CM | POA: Insufficient documentation

## 2019-02-28 DIAGNOSIS — F17220 Nicotine dependence, chewing tobacco, uncomplicated: Secondary | ICD-10-CM | POA: Insufficient documentation

## 2019-02-28 DIAGNOSIS — R0602 Shortness of breath: Secondary | ICD-10-CM | POA: Diagnosis present

## 2019-02-28 MED ORDER — PREDNISONE 20 MG PO TABS: 60.0000 mg | ORAL_TABLET | Freq: Every day | ORAL | 0 refills | Status: AC

## 2019-02-28 MED ORDER — ALBUTEROL SULFATE HFA 108 (90 BASE) MCG/ACT IN AERS: 2.0000 | INHALATION_SPRAY | Freq: Four times a day (QID) | RESPIRATORY_TRACT | 0 refills | Status: AC | PRN

## 2019-02-28 MED ORDER — ALBUTEROL SULFATE HFA 108 (90 BASE) MCG/ACT IN AERS
2.0000 | INHALATION_SPRAY | Freq: Once | RESPIRATORY_TRACT | Status: AC
  Administered 2019-02-28: 01:00:00 2 via RESPIRATORY_TRACT
  Filled 2019-02-28: qty 6.7

## 2019-02-28 MED ORDER — ALBUTEROL SULFATE (2.5 MG/3ML) 0.083% IN NEBU: 2.5000 mg | INHALATION_SOLUTION | Freq: Four times a day (QID) | RESPIRATORY_TRACT | 12 refills | Status: AC | PRN

## 2019-02-28 NOTE — Discharge Instructions (Addendum)
Take the prednisone as prescribed for the next 4 days and finish the full course.  Continue to use albuterol every 4-6 hours as needed for shortness of breath.  Return to the ER for new, worsening, or persistent severe shortness of breath, fever, weakness, or any other new or worsening symptoms that concern you.

## 2019-02-28 NOTE — ED Notes (Signed)
No peripheral IV placed this visit.   Discharge instructions reviewed with patient. Questions fielded by this RN. Patient verbalizes understanding of instructions. Patient discharged home in stable condition per siadecki. No acute distress noted at time of discharge.    

## 2019-02-28 NOTE — ED Provider Notes (Signed)
[facility] Emergency Department Provider Note ____________________________________________   First MD Initiated Contact with Patient 02/28/19 [phone]     (approximate)  I have reviewed the triage vital signs and the nursing notes.   HISTORY  Chief Complaint Asthma    HPI Collin Manchesterdward Belanger is a 28 y.o. male with a history of asthma who presents with shortness of breath the last few nights, waking him up from sleep, and associated with wheezing.  The patient states that he ran out of his albuterol nebulizer and left his inhaler at his work bag.  He denies chest pain, fever, vomiting, diarrhea, weakness, or other acute symptoms.  Past Medical History:  Diagnosis Date   Asthma     There are no active problems to display for this patient.   Past Surgical History:  Procedure Laterality Date   TESTICLE TORSION REDUCTION     TONSILLECTOMY     TOTAL HIP ARTHROPLASTY      Prior to Admission medications   Medication Sig Start Date End Date Taking? Authorizing Provider  albuterol (PROVENTIL HFA;VENTOLIN HFA) 108 (90 Base) MCG/ACT inhaler Inhale 2 puffs into the lungs every 6 (six) hours as needed for wheezing or shortness of breath. 08/31/17   Joni ReiningSmith, Ronald K, PA-C  albuterol (PROVENTIL) (2.5 MG/3ML) 0.083% nebulizer solution Take 3 mLs (2.5 mg total) by nebulization every 6 (six) hours as needed for wheezing or shortness of breath. 02/28/19   Dionne BucySiadecki, Emme Rosenau, MD  albuterol (VENTOLIN HFA) 108 (90 Base) MCG/ACT inhaler Inhale 2 puffs into the lungs every 6 (six) hours as needed for wheezing or shortness of breath. 02/28/19   Dionne BucySiadecki, Michio Thier, MD  amoxicillin (AMOXIL) 500 MG capsule Take 1 capsule (500 mg total) by mouth 3 (three) times daily. 08/31/17   Joni ReiningSmith, Ronald K, PA-C  fexofenadine-pseudoephedrine (ALLEGRA-D) 60-120 MG 12 hr tablet Take 1 tablet by mouth 2 (two) times daily. 08/31/17   Joni ReiningSmith, Ronald K, PA-C  meloxicam (MOBIC) 15 MG tablet Take 1 tablet  (15 mg total) by mouth daily. 06/12/16   Cuthriell, Delorise RoyalsJonathan D, PA-C  methocarbamol (ROBAXIN) 500 MG tablet Take 1 tablet (500 mg total) by mouth 4 (four) times daily. 06/12/16   Cuthriell, Delorise RoyalsJonathan D, PA-C  naproxen (NAPROSYN) 500 MG tablet Take 1 tablet (500 mg total) by mouth 2 (two) times daily with a meal. 08/31/17   Joni ReiningSmith, Ronald K, PA-C  predniSONE (DELTASONE) 20 MG tablet Take 3 tablets (60 mg total) by mouth daily for 4 days. 02/28/19 03/04/19  Dionne BucySiadecki, Augustin Bun, MD    Allergies Peanut-containing drug products  No family history on file.  Social History Social History   Tobacco Use   Smoking status: Never Smoker   Smokeless tobacco: Current User  Substance Use Topics   Alcohol use: Yes    Comment: occ   Drug use: No    Review of Systems  Constitutional: No fever. Eyes: No visual changes. ENT: No sore throat. Cardiovascular: Denies chest pain. Respiratory: Positive for shortness of breath. Gastrointestinal: No vomiting or diarrhea.  Genitourinary: Negative for flank pain.  Musculoskeletal: Negative for back pain. Skin: Negative for rash. Neurological: Negative for headache.   ____________________________________________   PHYSICAL EXAM:  VITAL SIGNS: ED Triage Vitals [02/28/19 0109]  Enc Vitals Group     BP (!) 144/90     Pulse Rate 86     Resp 20     Temp 98 F (36.7 C)     Temp Source Oral     SpO2 94 %  Weight 230 lb (104.3 kg)     Height 5\' 5"  (1.651 m)     Head Circumference      Peak Flow      Pain Score 0     Pain Loc      Pain Edu?      Excl. in [place]?     Constitutional: Alert and oriented. Well appearing and in no acute distress. Eyes: Conjunctivae are normal.  Head: Atraumatic. Nose: No congestion/rhinnorhea. Mouth/Throat: Mucous membranes are moist.   Neck: Normal range of motion.  Cardiovascular: Normal rate, regular rhythm. Grossly normal heart sounds.  Good peripheral circulation. Respiratory: Normal respiratory effort.  No  retractions. Lungs CTAB. Gastrointestinal: No distention.  Musculoskeletal: Extremities warm and well perfused.  Neurologic:  Normal speech and language. No gross focal neurologic deficits are appreciated.  Skin:  Skin is warm and dry. No rash noted. Psychiatric: Mood and affect are normal. Speech and behavior are normal.  ____________________________________________   LABS (all labs ordered are listed, but only abnormal results are displayed)  Labs Reviewed - No data to display ____________________________________________  EKG   ____________________________________________  RADIOLOGY    ____________________________________________   PROCEDURES  Procedure(s) performed: No  Procedures  Critical Care performed: No ____________________________________________   INITIAL IMPRESSION / ASSESSMENT AND PLAN / ED COURSE  Pertinent labs & imaging results that were available during my care of the patient were reviewed by me and considered in my medical decision making (see chart for details).  28 year old male with a history of asthma presents with shortness of breath that wakes him up from sleep over the last few nights and is associated with wheezing.  He states it feels like his asthma.  However, he ran out of his albuterol for nebulization and left his MDI and his work bag.  On my exam, the patient is well-appearing and his vital signs are normal.  He has no respiratory distress or increased work of breathing and his lungs are clear.  However he did have audible wheezing when he initially presented which resolved after he was given albuterol from an MDI.  Overall presentation is consistent with mild asthma exacerbation.  He has no fever or any evidence of pneumonia, COVID-19, or other infectious etiology.  Since the patient has had recurrent shortness of breath over a few nights I will prescribe a course of prednisone as well as albuterol for his nebulizer.  Since his symptoms  are now resolved and he has no active wheezing or shortness of breath he is stable for discharge home.  Return precautions given, and he expresses understanding.  _______________________________  Victorino Dike was evaluated in Emergency Department on 02/28/2019 for the symptoms described in the history of present illness. He was evaluated in the context of the global COVID-19 pandemic, which necessitated consideration that the patient might be at risk for infection with the SARS-CoV-2 virus that causes COVID-19. Institutional protocols and algorithms that pertain to the evaluation of patients at risk for COVID-19 are in a state of rapid change based on information released by regulatory bodies including the CDC and federal and state organizations. These policies and algorithms were followed during the patient's care in the ED.    ____________________________________________   FINAL CLINICAL IMPRESSION(S) / ED DIAGNOSES  Final diagnoses:  Exacerbation of asthma, unspecified asthma severity, unspecified whether persistent      NEW MEDICATIONS STARTED DURING THIS VISIT:  New Prescriptions   ALBUTEROL (PROVENTIL) (2.5 MG/3ML) 0.083% NEBULIZER SOLUTION    Take 3  mLs (2.5 mg total) by nebulization every 6 (six) hours as needed for wheezing or shortness of breath.   ALBUTEROL (VENTOLIN HFA) 108 (90 BASE) MCG/ACT INHALER    Inhale 2 puffs into the lungs every 6 (six) hours as needed for wheezing or shortness of breath.   PREDNISONE (DELTASONE) 20 MG TABLET    Take 3 tablets (60 mg total) by mouth daily for 4 days.     Note:  This document was prepared using Dragon voice recognition software and may include unintentional dictation errors.    Dionne BucySiadecki, Joell Usman, MD 02/28/19 [phone]

## 2019-02-28 NOTE — ED Triage Notes (Signed)
Pt states history of asthma. Pt with audible wheezing noted. Pt states he is out of his albuterol inhaler.

## 2019-02-28 NOTE — ED Notes (Signed)
Pt reports woke to allergy attack which happens infrequently, hx of asthma and pt was out of nebulizers and rescue inhaler  Pt reports SOB has passed, NAD pt appears calm, expiratory wheezes through out

## 2019-07-25 ENCOUNTER — Emergency Department: Payer: BC Managed Care – PPO

## 2019-07-25 ENCOUNTER — Encounter: Payer: Self-pay | Admitting: Emergency Medicine

## 2019-07-25 ENCOUNTER — Other Ambulatory Visit: Payer: Self-pay

## 2019-07-25 DIAGNOSIS — S301XXA Contusion of abdominal wall, initial encounter: Secondary | ICD-10-CM | POA: Insufficient documentation

## 2019-07-25 DIAGNOSIS — Z91010 Allergy to peanuts: Secondary | ICD-10-CM | POA: Diagnosis not present

## 2019-07-25 DIAGNOSIS — J45909 Unspecified asthma, uncomplicated: Secondary | ICD-10-CM | POA: Insufficient documentation

## 2019-07-25 DIAGNOSIS — S3991XA Unspecified injury of abdomen, initial encounter: Secondary | ICD-10-CM | POA: Diagnosis present

## 2019-07-25 DIAGNOSIS — Z79899 Other long term (current) drug therapy: Secondary | ICD-10-CM | POA: Insufficient documentation

## 2019-07-25 DIAGNOSIS — Y92828 Other wilderness area as the place of occurrence of the external cause: Secondary | ICD-10-CM | POA: Diagnosis not present

## 2019-07-25 DIAGNOSIS — Y939 Activity, unspecified: Secondary | ICD-10-CM | POA: Insufficient documentation

## 2019-07-25 DIAGNOSIS — Y999 Unspecified external cause status: Secondary | ICD-10-CM | POA: Insufficient documentation

## 2019-07-25 DIAGNOSIS — R0789 Other chest pain: Secondary | ICD-10-CM | POA: Diagnosis not present

## 2019-07-25 LAB — COMPREHENSIVE METABOLIC PANEL
ALT: 30 U/L (ref 0–44)
AST: 29 U/L (ref 15–41)
Albumin: 4.1 g/dL (ref 3.5–5.0)
Alkaline Phosphatase: 75 U/L (ref 38–126)
Anion gap: 12 (ref 5–15)
BUN: 13 mg/dL (ref 6–20)
CO2: 23 mmol/L (ref 22–32)
Calcium: 9.2 mg/dL (ref 8.9–10.3)
Chloride: 105 mmol/L (ref 98–111)
Creatinine, Ser: 0.97 mg/dL (ref 0.61–1.24)
GFR calc Af Amer: >60 mL/min (ref >60)
GFR calc non Af Amer: >60 mL/min (ref >60)
Glucose, Bld: 130 mg/dL — ABNORMAL HIGH (ref 70–99)
Potassium: 3.4 mmol/L — ABNORMAL LOW (ref 3.5–5.1)
Sodium: 140 mmol/L (ref 135–145)
Total Bilirubin: 0.8 mg/dL (ref 0.3–1.2)
Total Protein: 7.4 g/dL (ref 6.5–8.1)

## 2019-07-25 LAB — CBC WITH DIFFERENTIAL/PLATELET
Abs Immature Granulocytes: 0.02 10*3/uL (ref 0.00–0.07)
Basophils Absolute: 0.1 10*3/uL (ref 0.0–0.1)
Basophils Relative: 1 %
Eosinophils Absolute: 0.5 10*3/uL (ref 0.0–0.5)
Eosinophils Relative: 4 %
HCT: 44.9 % (ref 39.0–52.0)
Hemoglobin: 14.8 g/dL (ref 13.0–17.0)
Immature Granulocytes: 0 %
Lymphocytes Relative: 16 %
Lymphs Abs: 1.9 10*3/uL (ref 0.7–4.0)
MCH: 24.9 pg — ABNORMAL LOW (ref 26.0–34.0)
MCHC: 33 g/dL (ref 30.0–36.0)
MCV: 75.6 fL — ABNORMAL LOW (ref 80.0–100.0)
Monocytes Absolute: 0.7 10*3/uL (ref 0.1–1.0)
Monocytes Relative: 6 %
Neutro Abs: 8.2 10*3/uL — ABNORMAL HIGH (ref 1.7–7.7)
Neutrophils Relative %: 73 %
Platelets: 289 10*3/uL (ref 150–400)
RBC: 5.94 MIL/uL — ABNORMAL HIGH (ref 4.22–5.81)
RDW: 14.9 % (ref 11.5–15.5)
WBC: 11.3 10*3/uL — ABNORMAL HIGH (ref 4.0–10.5)
nRBC: 0 % (ref 0.0–0.2)

## 2019-07-25 LAB — ETHANOL: Alcohol, Ethyl (B): <10 mg/dL (ref <10)

## 2019-07-25 LAB — LIPASE, BLOOD: Lipase: 43 U/L (ref 11–51)

## 2019-07-25 MED ORDER — IOHEXOL 300 MG/ML  SOLN
100.0000 mL | Freq: Once | INTRAMUSCULAR | Status: AC | PRN
  Administered 2019-07-25: 100 mL via INTRAVENOUS

## 2019-07-25 NOTE — ED Notes (Signed)
Patient to ct scan.
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
## 2019-07-26 ENCOUNTER — Emergency Department
Admission: EM | Admit: 2019-07-26 | Discharge: 2019-07-26 | Disposition: A | Discharge status: Home / Self Care | Payer: BC Managed Care – PPO | Attending: Emergency Medicine | Admitting: Emergency Medicine

## 2019-07-26 DIAGNOSIS — S301XXA Contusion of abdominal wall, initial encounter: Secondary | ICD-10-CM
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
## 2020-06-10 ENCOUNTER — Emergency Department
Admission: EM | Admit: 2020-06-10 | Discharge: 2020-06-10 | Disposition: A | Discharge status: Home / Self Care | Payer: BC Managed Care – PPO | Attending: Emergency Medicine | Admitting: Emergency Medicine

## 2020-06-10 ENCOUNTER — Other Ambulatory Visit: Payer: Self-pay

## 2020-06-10 DIAGNOSIS — Z5321 Procedure and treatment not carried out due to patient leaving prior to being seen by health care provider: Secondary | ICD-10-CM | POA: Insufficient documentation

## 2020-06-10 DIAGNOSIS — R5383 Other fatigue: Secondary | ICD-10-CM | POA: Insufficient documentation

## 2020-06-10 NOTE — ED Triage Notes (Signed)
Pt states that he woke up this morning and has been tired and laying around the house all day with chills.

## 2020-06-10 NOTE — ED Notes (Signed)
Attempted to call for pt x1 no response

## 2020-06-16 ENCOUNTER — Emergency Department: Payer: Self-pay

## 2020-06-16 ENCOUNTER — Other Ambulatory Visit: Payer: Self-pay

## 2020-06-16 ENCOUNTER — Emergency Department
Admission: EM | Admit: 2020-06-16 | Discharge: 2020-06-16 | Disposition: A | Discharge status: Home / Self Care | Payer: Self-pay | Attending: Emergency Medicine | Admitting: Emergency Medicine

## 2020-06-16 DIAGNOSIS — Z8616 Personal history of COVID-19: Secondary | ICD-10-CM | POA: Insufficient documentation

## 2020-06-16 DIAGNOSIS — M79606 Pain in leg, unspecified: Secondary | ICD-10-CM | POA: Insufficient documentation

## 2020-06-16 DIAGNOSIS — Z5321 Procedure and treatment not carried out due to patient leaving prior to being seen by health care provider: Secondary | ICD-10-CM | POA: Insufficient documentation

## 2020-06-16 DIAGNOSIS — R0602 Shortness of breath: Secondary | ICD-10-CM | POA: Insufficient documentation

## 2020-06-16 DIAGNOSIS — R55 Syncope and collapse: Secondary | ICD-10-CM | POA: Insufficient documentation

## 2020-06-16 LAB — CBC
HCT: 45 % (ref 39.0–52.0)
Hemoglobin: 15 g/dL (ref 13.0–17.0)
MCH: 25.1 pg — ABNORMAL LOW (ref 26.0–34.0)
MCHC: 33.3 g/dL (ref 30.0–36.0)
MCV: 75.3 fL — ABNORMAL LOW (ref 80.0–100.0)
Platelets: 191 10*3/uL (ref 150–400)
RBC: 5.98 MIL/uL — ABNORMAL HIGH (ref 4.22–5.81)
RDW: 15.6 % — ABNORMAL HIGH (ref 11.5–15.5)
WBC: 4.7 10*3/uL (ref 4.0–10.5)
nRBC: 0 % (ref 0.0–0.2)

## 2020-06-16 LAB — TROPONIN I (HIGH SENSITIVITY)
Troponin I (High Sensitivity): 7 ng/L (ref <18)
Troponin I (High Sensitivity): 7 ng/L (ref <18)

## 2020-06-16 LAB — BASIC METABOLIC PANEL
Anion gap: 13 (ref 5–15)
BUN: 13 mg/dL (ref 6–20)
CO2: 23 mmol/L (ref 22–32)
Calcium: 8.6 mg/dL — ABNORMAL LOW (ref 8.9–10.3)
Chloride: 100 mmol/L (ref 98–111)
Creatinine, Ser: 1.22 mg/dL (ref 0.61–1.24)
GFR calc Af Amer: >60 mL/min (ref >60)
GFR calc non Af Amer: >60 mL/min (ref >60)
Glucose, Bld: 109 mg/dL — ABNORMAL HIGH (ref 70–99)
Potassium: 4.2 mmol/L (ref 3.5–5.1)
Sodium: 136 mmol/L (ref 135–145)
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
## 2020-06-16 NOTE — ED Notes (Signed)
Called no answer in lobby  

## 2020-06-16 NOTE — ED Notes (Signed)
Called times 4   No answer in lobby

## 2021-02-02 IMAGING — CR DG CHEST 2V
1 series · 2 of 2 positions shown · non-contrast
Comparison: 09/14/2014, 07/25/2019

CLINICAL DATA: Chest pain.  OYLL[facility]-OC infection

EXAM:
CHEST - 2 VIEW

[Series 1: dg chest 2 view · 0.14mm/px · 2 of 2 slices shown]
[im 1/2]
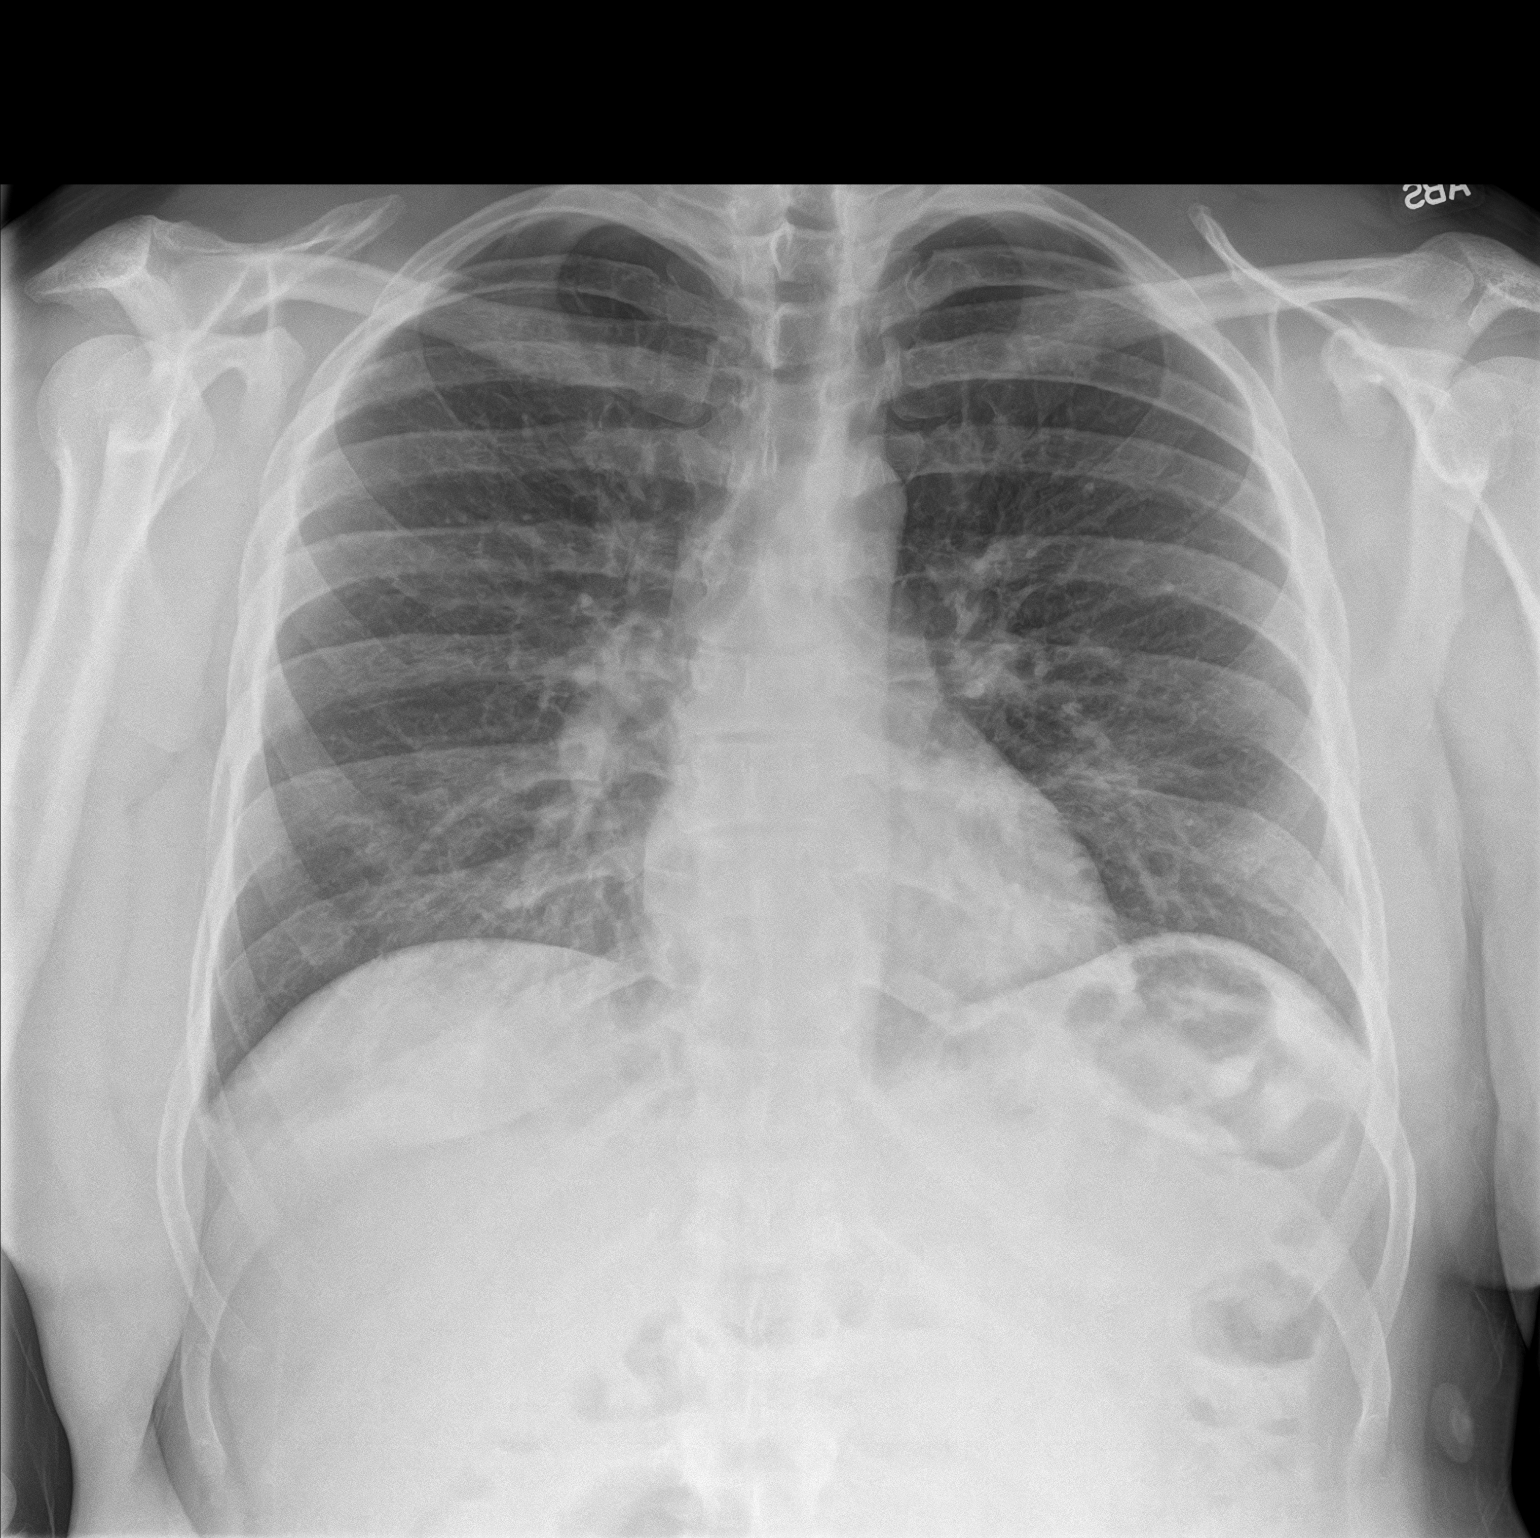
[im 2/2]
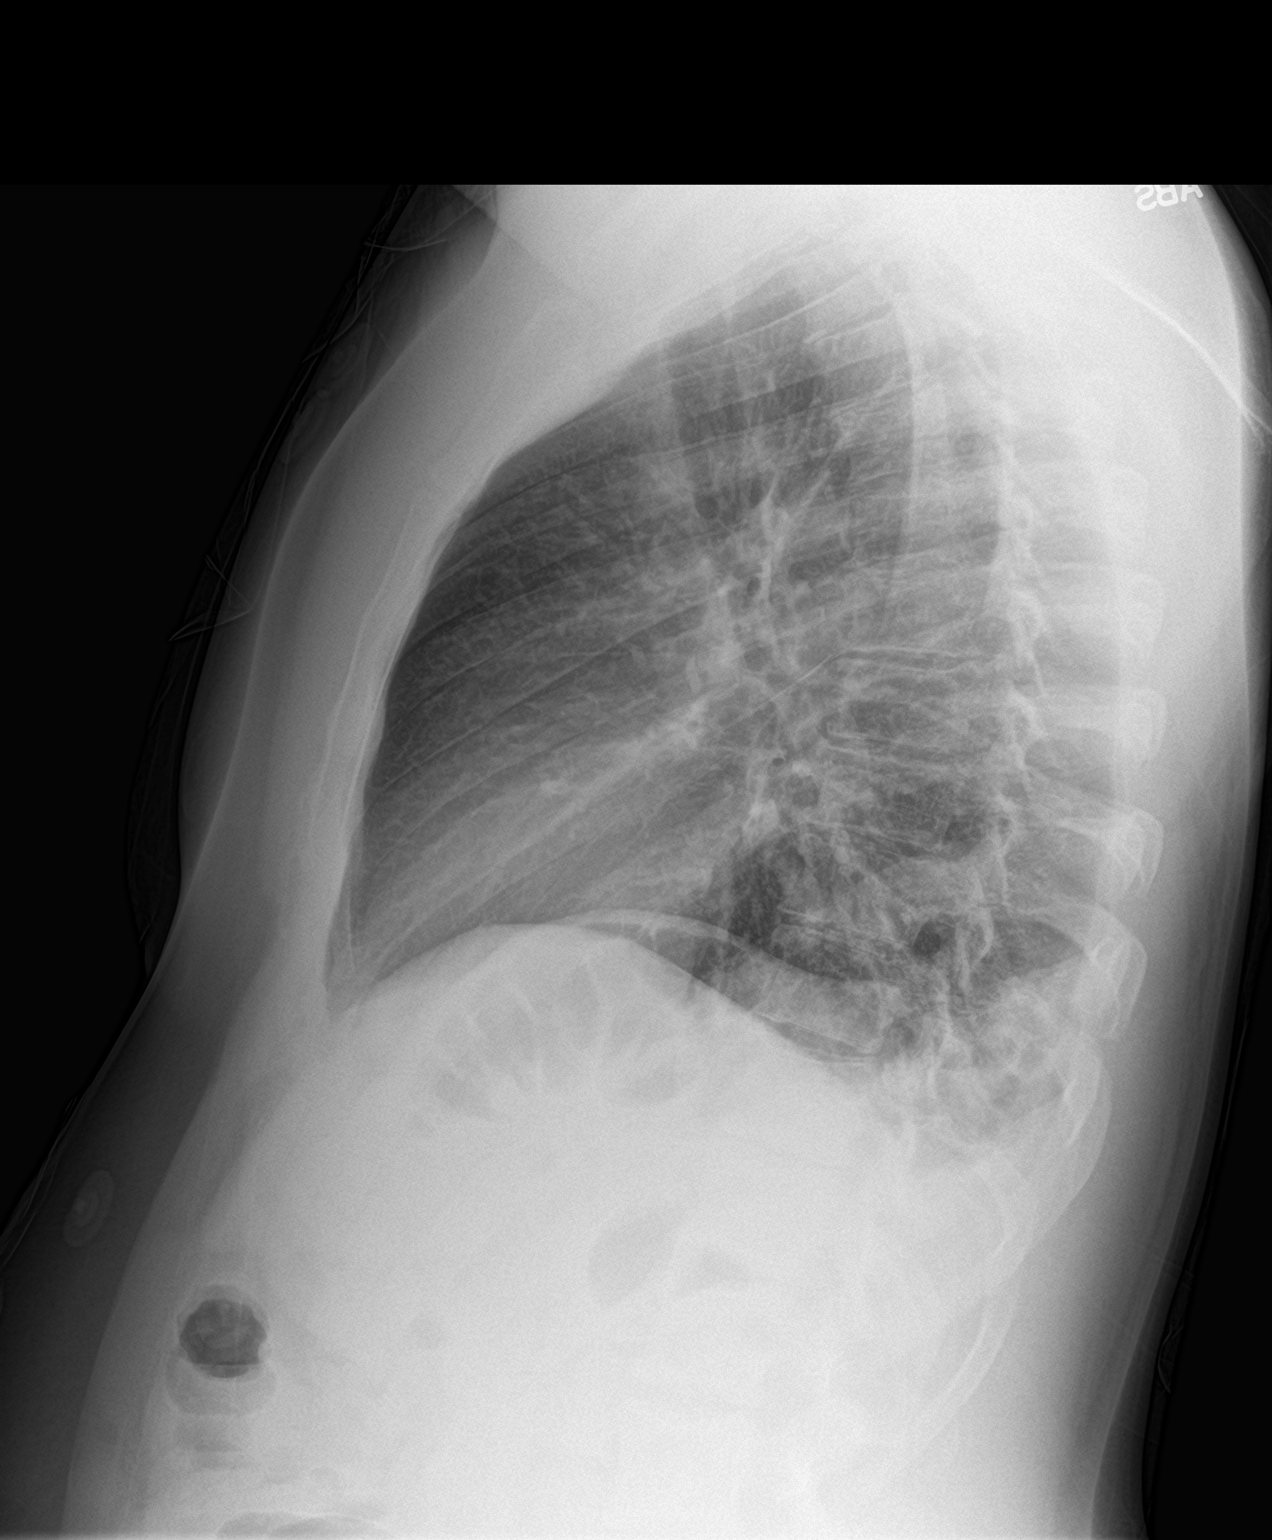

[2 of 2 positions shown; findings below may reference images not displayed]

FINDINGS: The heart size and mediastinal contours are within normal limits.
Patchy airspace consolidation at the posterior costophrenic angle,
likely within the medial left lung base. No pleural effusion or
pneumothorax. The visualized skeletal structures are unremarkable.
IMPRESSION: Patchy airspace consolidation at the posterior costophrenic angle,
likely within the medial left lung base, concerning for pneumonia.

## 2024-01-24 ENCOUNTER — Encounter: Payer: Self-pay | Admitting: Emergency Medicine

## 2024-01-24 ENCOUNTER — Emergency Department
Admission: EM | Admit: 2024-01-24 | Discharge: 2024-01-24 | Disposition: A | Discharge status: Home / Self Care | Payer: Self-pay | Attending: Emergency Medicine | Admitting: Emergency Medicine

## 2024-01-24 ENCOUNTER — Other Ambulatory Visit: Payer: Self-pay

## 2024-01-24 DIAGNOSIS — W450XXA Nail entering through skin, initial encounter: Secondary | ICD-10-CM | POA: Insufficient documentation

## 2024-01-24 DIAGNOSIS — Z23 Encounter for immunization: Secondary | ICD-10-CM | POA: Insufficient documentation

## 2024-01-24 DIAGNOSIS — S91332A Puncture wound without foreign body, left foot, initial encounter: Secondary | ICD-10-CM | POA: Insufficient documentation

## 2024-01-24 DIAGNOSIS — S99922A Unspecified injury of left foot, initial encounter: Secondary | ICD-10-CM | POA: Diagnosis present

## 2024-01-24 MED ORDER — AMOXICILLIN-POT CLAVULANATE 875-125 MG PO TABS: 1.0000 | ORAL_TABLET | Freq: Two times a day (BID) | ORAL | 0 refills | Status: AC

## 2024-01-24 MED ORDER — AMOXICILLIN-POT CLAVULANATE 875-125 MG PO TABS
1.0000 | ORAL_TABLET | Freq: Once | ORAL | Status: AC
  Administered 2024-01-24: 1 via ORAL
  Filled 2024-01-24: qty 1

## 2024-01-24 MED ORDER — TETANUS-DIPHTH-ACELL PERTUSSIS 5-2.5-18.5 LF-MCG/0.5 IM SUSY
0.5000 mL | PREFILLED_SYRINGE | Freq: Once | INTRAMUSCULAR | Status: AC
  Administered 2024-01-24: 0.5 mL via INTRAMUSCULAR
  Filled 2024-01-24: qty 0.5

## 2024-01-24 NOTE — ED Provider Notes (Signed)
 [facility] Provider Note    Event Date/Time   First MD Initiated Contact with Patient 01/24/24 1946     (approximate)   History   Foot Injury    HPI  Collin Cox is a 33 y.o. male, with no significant past medical history who presents to the ED complaining of pain in left foot. According to the patient, he was cleaning his shed and stepped on a rusty nail.  Last Tdap more than 10 years ago.  Patient denies history of diabetes.       Physical Exam   Triage Vital Signs: ED Triage Vitals  Encounter Vitals Group     BP 01/24/24 1937 135/82     Systolic BP Percentile --      Diastolic BP Percentile --      Pulse Rate 01/24/24 1937 61     Resp 01/24/24 1937 18     Temp 01/24/24 1937 98.2 F (36.8 C)     Temp Source 01/24/24 1937 Oral     SpO2 01/24/24 1937 99 %     Weight 01/24/24 1938 207 lb 0.2 oz (93.9 kg)     Height --      Head Circumference --      Peak Flow --      Pain Score 01/24/24 1938 2     Pain Loc --      Pain Education --      Exclude from Growth Chart --     Most recent vital signs: Vitals:   01/24/24 1937  BP: 135/82  Pulse: 61  Resp: 18  Temp: 98.2 F (36.8 C)  SpO2: 99%     Constitutional: Alert, NAD. Able to speak in complete sentences without cough or dyspnea  Eyes: Conjunctivae are normal.  Head: Atraumatic. Nose: No congestion/rhinnorhea. Mouth/Throat: Mucous membranes are moist.   Neck: Painless ROM. Supple. No JVD, nodes, thyromegaly  Cardiovascular:   Good peripheral circulation.RRR no murmurs, gallops, rubs  Respiratory: Normal respiratory effort.  No retractions. Clear to auscultation bilaterally without wheezing or crackles  Gastrointestinal: Soft and nontender.  Musculoskeletal:  no deformity Left foot presence of punctuated patient at the level of the far metatarsal and the plantar area.  No erythema, no edema, no hardness, no active bleeding.  Full ROM: Neurologic:  MAE spontaneously. No  gross focal neurologic deficits are appreciated.  Skin:  Skin is warm, dry and intact. No rash noted. Psychiatric: Mood and affect are normal. Speech and behavior are normal.    ED Results / Procedures / Treatments   Labs (all labs ordered are listed, but only abnormal results are displayed) Labs Reviewed - No data to display   EKG     RADIOLOGY    PROCEDURES:  Critical Care performed:   .Incision and Drainage  Date/Time: 01/24/2024 8:24 PM  Performed by: Awilda Lennox, PA-C Authorized by: Awilda Lennox, PA-C   Consent:    Consent obtained:  Verbal   Consent given by:  Patient   Risks, benefits, and alternatives were discussed: yes     Risks discussed:  Bleeding and pain Universal protocol:    Procedure explained and questions answered to patient or proxy's satisfaction: yes     Patient identity confirmed:  Verbally with patient Location:    Indications for incision and drainage: Punctuated lesion.   Size:  2 mm   Location:  Lower extremity   Lower extremity location:  Foot   Foot location:  L foot Pre-procedure details:  Skin preparation:  Povidone-iodine Sedation:    Sedation type:  None Anesthesia:    Anesthesia method:  None Procedure type:    Complexity:  Simple Procedure details:    Ultrasound guidance: no     Needle aspiration: no     Packing materials:  None Post-procedure details:    Procedure completion:  Tolerated well, no immediate complications    MEDICATIONS ORDERED IN ED: Medications  Tdap (BOOSTRIX) injection 0.5 mL (has no administration in time range)  amoxicillin -clavulanate (AUGMENTIN) 875-125 MG per tablet 1 tablet (has no administration in time range)      IMPRESSION / MDM / ASSESSMENT AND PLAN / ED COURSE  I reviewed the triage vital signs and the nursing notes.  Differential diagnosis includes, but is not limited to, laceration, foreign body, cellulitis  Patient's presentation is most consistent with acute,  uncomplicated illness.   Patient's diagnosis is consistent with injury of left foot.  I did review the patient's allergies and medications.During admission patient got Tdap, Augmentin.  The patient is in stable and satisfactory condition for discharge home  Patient will be discharged home with prescriptions for Augmentin. Patient is to follow up with PCP as needed or otherwise directed. Patient is given ED precautions to return to the ED for any worsening or new symptoms. Discussed plan of care with patient, answered all of patient's questions, Patient agreeable to plan of care. Advised patient to take medications according to the instructions on the label. Discussed possible side effects of new medications. Patient verbalized understanding.    FINAL CLINICAL IMPRESSION(S) / ED DIAGNOSES   Final diagnoses:  Injury of left foot, initial encounter     Rx / DC Orders   ED Discharge Orders          Ordered    amoxicillin -clavulanate (AUGMENTIN) 875-125 MG tablet  2 times daily        01/24/24 2027             Note:  This document was prepared using Dragon voice recognition software and may include unintentional dictation errors.   Awilda Lennox, PA-C 01/24/24 2027    Kandee Orion, MD 01/24/24 [phone]

## 2024-01-24 NOTE — Discharge Instructions (Signed)
 You have been diagnosed with left foot injury.  Please take Augmentin 1 tablet by mouth every 12 hours after main meals for 7 days.  Come back to ED or go to your PCP if you have new symptoms or symptoms worsen.

## 2024-01-24 NOTE — ED Triage Notes (Signed)
 Pt in after stepping on rusty nail with L foot while working out in his shed this afternoon. Pt was wearing shoe and sock, has small area of redness near toes but no bleeding noted. Last tetanus >15yrs
